# Patient Record
Sex: Male | Born: 1970 | Race: Asian | Hispanic: No | Marital: Married | Smoking: Never smoker
Health system: Southern US, Community
[De-identification: ages and names within clinical notes are randomized; demographics above are authoritative.]

## PROBLEM LIST (undated history)

## (undated) DIAGNOSIS — I10 Essential (primary) hypertension: Secondary | ICD-10-CM

---

## 2009-10-05 ENCOUNTER — Emergency Department (HOSPITAL_COMMUNITY): Admission: EM | Admit: 2009-10-05 | Discharge: 2009-10-05 | Payer: Self-pay | Admitting: Family Medicine

## 2012-03-22 ENCOUNTER — Emergency Department (HOSPITAL_COMMUNITY)
Admission: EM | Admit: 2012-03-22 | Discharge: 2012-03-22 | Disposition: A | Discharge status: Home / Self Care | Payer: Self-pay | Attending: Emergency Medicine | Admitting: Emergency Medicine

## 2012-03-22 ENCOUNTER — Encounter (HOSPITAL_COMMUNITY): Payer: Self-pay | Admitting: *Deleted

## 2012-03-22 DIAGNOSIS — S61309A Unspecified open wound of unspecified finger with damage to nail, initial encounter: Secondary | ICD-10-CM

## 2012-03-22 DIAGNOSIS — M79609 Pain in unspecified limb: Secondary | ICD-10-CM | POA: Insufficient documentation

## 2012-03-22 DIAGNOSIS — S61209A Unspecified open wound of unspecified finger without damage to nail, initial encounter: Secondary | ICD-10-CM | POA: Insufficient documentation

## 2012-03-22 NOTE — ED Notes (Signed)
To ED for eval of left thumb nail lifting from bed. Pt states he slammed thumb in a door prior and as it heals it has lifted.

## 2012-03-22 NOTE — ED Provider Notes (Signed)
History     CSN: [id number]  Arrival date & time 03/22/12  1538   First MD Initiated Contact with Patient 03/22/12 1604      Chief Complaint  Patient presents with  . Nail Problem    (Consider location/radiation/quality/duration/timing/severity/associated sxs/prior treatment) HPI  Patient presents to ER complaining of left thumb/thumb nail injury that occurred 2 weeks ago when he slammed thumb in door. Patient states that initially there was pain, swelling and subungual hematoma. Patient states he iced and took OTC pain meds with no complication and resolution of pain, swelling and hematoma however states that has hematoma resolved, the nail began to lift up from base and is "loose all the way to the cuticle, it's not attached anymore." denies associated pain.   History reviewed. No pertinent past medical history.  History reviewed. No pertinent past surgical history.  History reviewed. No pertinent family history.  History  Substance Use Topics  . Smoking status: Not on file  . Smokeless tobacco: Not on file  . Alcohol Use: No      Review of Systems  All other systems reviewed and are negative.    Allergies  Review of patient's allergies indicates no known allergies.  Home Medications  No current outpatient prescriptions on file.  BP 148/107  Pulse 88  Temp 98.2 F (36.8 C)  Resp 16  SpO2 98%  Physical Exam  Nursing note and vitals reviewed. Constitutional: He is oriented to person, place, and time. He appears well-developed and well-nourished. No distress.  HENT:  Head: Normocephalic and atraumatic.  Cardiovascular: Normal rate.   Pulmonary/Chest: Effort normal.  Abdominal: Bowel sounds are normal.  Musculoskeletal: Normal range of motion. He exhibits no edema and no tenderness.  Neurological: He is alert and oriented to person, place, and time.  Skin: Skin is warm and dry. No rash noted. He is not diaphoretic. No erythema.       Completely avulsed  left thumb nail with attachment only at cuticle at a 2mm area beneath cuticle. Underlying nail bed healed over without friability, bleeding, redness, swelling or TTP.   Psychiatric: He has a normal mood and affect.    ED Course  NAIL REMOVAL Date/Time: 03/22/2012 5:03 PM Performed by: Jenness Corner Authorized by: Jenness Corner Consent: Verbal consent obtained. Risks and benefits: risks, benefits and alternatives were discussed Consent given by: patient Patient understanding: patient states understanding of the procedure being performed Patient consent: the patient's understanding of the procedure matches consent given Patient identity confirmed: verbally with patient Location: left hand Location details: left thumb Patient sedated: no Preparation: skin prepped with alcohol Amount removed: complete Nail bed sutured: no Nail matrix removed: none Dressing: antibiotic ointment Patient tolerance: Patient tolerated the procedure well with no immediate complications.   (including critical care time)  Labs Reviewed - No data to display No results found.   No diagnosis found.    MDM  Avulsed thumb nail completely to cuticle with underlying nail bed healed over with no bleeding or friability. Nail removed back to cuticle and explained to patient that IF he gets nail growth back that the nail could be deformed or he may not get regrowth. Delay in presentation prevents opportunity to salvage nail if it even could have been salvaged at time of injury. Good cap refill refill of nail bed with normal sensation. FROM of entire thumb without pain. No signs of infection.          Jenness Corner, [place] 03/22/12  1713 

## 2012-03-22 NOTE — ED Provider Notes (Signed)
Medical screening examination/treatment/procedure(s) were performed by non-physician practitioner and as supervising physician I was immediately available for consultation/collaboration.  Cheri Guppy, MD 03/22/12 1929

## 2012-03-22 NOTE — Discharge Instructions (Signed)
Fingernail or Toenail Loss All or part of your fingernail or toenail has been lost. This may or may not grow back as a normal nail. A special non-stick bandage has been put on your finger or toe tightly to prevent bleeding. HOME CARE INSTRUCTIONS  The tips of fingers and toes are full of nerves and injuries are often very painful. The following will help you decrease the pain and obtain the best outcome.  Keep your hand or foot elevated above your heart to relieve pain and swelling. This will require lying in bed or on a couch with the hand or leg on pillows or sitting in a recliner with the leg up. Letting your hand or leg dangle may increase swelling, slow healing and cause throbbing pain.   Keep your dressing dry and clean.   Change your bandage in 24 hours after going home.   After your bandage is changed, soak your hand or foot in warm soapy water for 10 to 20 minutes. Do this 3 times per day. This helps reduce pain and swelling. After soaking, apply a clean, dry bandage. Change your bandage if it is wet or dirty.   Only take over-the-counter or prescription medicines for pain, discomfort, or fever as directed by your caregiver.   See your caregiver as needed for problems.  SEEK IMMEDIATE MEDICAL CARE IF:   You have increased pain, swelling, drainage, or bleeding.   You have a fever.  MAKE SURE YOU:   Understand these instructions.   Will watch your condition.   Will get help right away if you are not doing well or get worse.  Document Released: 10/07/2006 Document Revised: 11/04/2011 Document Reviewed: 12/27/2006 [employer] Patient Information 2012 [employer]. 

## 2018-01-25 ENCOUNTER — Ambulatory Visit: Payer: BLUE CROSS/BLUE SHIELD | Admitting: Podiatry

## 2018-01-25 ENCOUNTER — Other Ambulatory Visit: Payer: Self-pay | Admitting: Podiatry

## 2018-01-25 ENCOUNTER — Encounter: Payer: Self-pay | Admitting: Podiatry

## 2018-01-25 ENCOUNTER — Ambulatory Visit (INDEPENDENT_AMBULATORY_CARE_PROVIDER_SITE_OTHER): Payer: BLUE CROSS/BLUE SHIELD

## 2018-01-25 DIAGNOSIS — L84 Corns and callosities: Secondary | ICD-10-CM

## 2018-01-25 DIAGNOSIS — M7751 Other enthesopathy of right foot: Secondary | ICD-10-CM | POA: Diagnosis not present

## 2018-01-25 DIAGNOSIS — M779 Enthesopathy, unspecified: Secondary | ICD-10-CM

## 2018-01-25 DIAGNOSIS — M79671 Pain in right foot: Secondary | ICD-10-CM

## 2018-01-25 NOTE — Progress Notes (Signed)
   Subjective:    Patient ID: Dominic Hernandez, male    DOB: 03/16/1971, 47 y.o.   MRN: [id number]  HPI    Review of Systems  All other systems reviewed and are negative.      Objective:   Physical Exam        Assessment & Plan:

## 2018-01-25 NOTE — Progress Notes (Signed)
Subjective:   Patient ID: Dominic Hernandez, male   DOB: 47 y.o.   MRN: [id number]   HPI Patient presents with quite a bit of discomfort plantar aspect right foot with keratotic lesion formation that is been thick and he states is been there for a number years with no history.  States he can get tender at times   Review of Systems  All other systems reviewed and are negative.       Objective:  Physical Exam  Constitutional: He appears well-developed and well-nourished.  Cardiovascular: Intact distal pulses.  Pulmonary/Chest: Effort normal.  Musculoskeletal: Normal range of motion.  Neurological: He is alert.  Skin: Skin is warm.  Nursing note and vitals reviewed.   Neurovascular status intact muscle strength adequate range of motion within normal limits with patient found to have a large keratotic lesion in the right arch with no proximal spread or odor or drainage noted     Assessment:  Abnormal keratotic tissue formation right with no apparent cause     Plan:  H&P x-ray reviewed condition discussed and using sharp sterile his mentation debridement accomplished.  This will be done periodically and patient will be seen back earlier if any issues should occur  X-rays indicate there is no indication to calcification or abnormal bone structure

## 2018-02-14 ENCOUNTER — Emergency Department (HOSPITAL_COMMUNITY)
Admission: EM | Admit: 2018-02-14 | Discharge: 2018-02-15 | Disposition: A | Discharge status: Home / Self Care | Payer: BLUE CROSS/BLUE SHIELD | Attending: Emergency Medicine | Admitting: Emergency Medicine

## 2018-02-14 ENCOUNTER — Encounter (HOSPITAL_COMMUNITY): Payer: Self-pay | Admitting: Emergency Medicine

## 2018-02-14 ENCOUNTER — Emergency Department (HOSPITAL_COMMUNITY): Payer: BLUE CROSS/BLUE SHIELD

## 2018-02-14 DIAGNOSIS — R1110 Vomiting, unspecified: Secondary | ICD-10-CM | POA: Diagnosis not present

## 2018-02-14 DIAGNOSIS — I10 Essential (primary) hypertension: Secondary | ICD-10-CM | POA: Diagnosis not present

## 2018-02-14 DIAGNOSIS — R42 Dizziness and giddiness: Secondary | ICD-10-CM | POA: Insufficient documentation

## 2018-02-14 DIAGNOSIS — Z79899 Other long term (current) drug therapy: Secondary | ICD-10-CM | POA: Insufficient documentation

## 2018-02-14 HISTORY — DX: Essential (primary) hypertension: I10

## 2018-02-14 LAB — URINALYSIS, ROUTINE W REFLEX MICROSCOPIC
Bilirubin Urine: NEGATIVE
Glucose, UA: NEGATIVE mg/dL
Hgb urine dipstick: NEGATIVE
Ketones, ur: 5 mg/dL — AB
LEUKOCYTES UA: NEGATIVE
NITRITE: NEGATIVE
PROTEIN: NEGATIVE mg/dL
Specific Gravity, Urine: 1.015 (ref 1.005–1.030)
pH: 5 (ref 5.0–8.0)

## 2018-02-14 LAB — BASIC METABOLIC PANEL
Anion gap: 11 (ref 5–15)
BUN: 10 mg/dL (ref 6–20)
CALCIUM: 8.9 mg/dL (ref 8.9–10.3)
CO2: 20 mmol/L — ABNORMAL LOW (ref 22–32)
CREATININE: 1.08 mg/dL (ref 0.61–1.24)
Chloride: 105 mmol/L (ref 101–111)
Glucose, Bld: 137 mg/dL — ABNORMAL HIGH (ref 65–99)
Potassium: 3.5 mmol/L (ref 3.5–5.1)
SODIUM: 136 mmol/L (ref 135–145)

## 2018-02-14 LAB — CBC
HCT: 44.1 % (ref 39.0–52.0)
Hemoglobin: 14.4 g/dL (ref 13.0–17.0)
MCH: 26 pg (ref 26.0–34.0)
MCHC: 32.7 g/dL (ref 30.0–36.0)
MCV: 79.6 fL (ref 78.0–100.0)
PLATELETS: 385 10*3/uL (ref 150–400)
RBC: 5.54 MIL/uL (ref 4.22–5.81)
RDW: 13.4 % (ref 11.5–15.5)
WBC: 8.8 10*3/uL (ref 4.0–10.5)

## 2018-02-14 LAB — CBG MONITORING, ED: GLUCOSE-CAPILLARY: 140 mg/dL — AB (ref 65–99)

## 2018-02-14 MED ORDER — ONDANSETRON 4 MG PO TBDP
4.0000 mg | ORAL_TABLET | Freq: Once | ORAL | Status: AC
  Administered 2018-02-14: 4 mg via ORAL
  Filled 2018-02-14: qty 1

## 2018-02-14 NOTE — ED Triage Notes (Signed)
Pt reports dizziness onset yesterday. Pt states he stood up and had a syncopal episode while in the restroom, reports hitting his head. States that today he has had HA and nausea, pt noted to be hypertensive in triage.

## 2018-02-15 MED ORDER — MECLIZINE HCL 25 MG PO TABS
25.0000 mg | ORAL_TABLET | Freq: Once | ORAL | Status: AC
  Administered 2018-02-15: 25 mg via ORAL
  Filled 2018-02-15: qty 1

## 2018-02-15 MED ORDER — MECLIZINE HCL 25 MG PO TABS: 25.0000 mg | ORAL_TABLET | Freq: Three times a day (TID) | ORAL | 0 refills | Status: DC | PRN

## 2018-02-15 MED ORDER — SODIUM CHLORIDE 0.9 % IV BOLUS (SEPSIS)
1000.0000 mL | Freq: Once | INTRAVENOUS | Status: AC
  Administered 2018-02-15: 1000 mL via INTRAVENOUS

## 2018-02-15 NOTE — ED Provider Notes (Signed)
[facility] EMERGENCY DEPARTMENT Provider Note   CSN: [id number] Arrival date & time: 02/14/18  1922     History   Chief Complaint Chief Complaint  Patient presents with  . Dizziness    HPI Dominic Hernandez is a 47 y.o. male.  HPI  This is a 47 year old male with a history of hypertension who presents with dizziness.  Primary language is Nepali.  Family member translates at the bedside.  Patient reports intermittent dizziness over the last several months.  He describes dizziness as room spinning.  It is worse with moving of his head and position change.  However, yesterday he had worsening dizziness.  He stood up and fell to the floor.  He denies actually losing consciousness.  He did hit his head.  At times he has nonbilious, nonbloody emesis with dizziness.  He denies headache to me but endorsed headache to triage.  He does report light sensitivity and nausea.  Denies any focal weakness, numbness, tingling.  Past Medical History:  Diagnosis Date  . Hypertension     There are no active problems to display for this patient.   History reviewed. No pertinent surgical history.     Home Medications    Prior to Admission medications   Medication Sig Start Date End Date Taking? Authorizing Provider  amLODipine-atorvastatin (CADUET) 5-20 MG tablet Take 1 tablet by mouth daily.   Yes [provider]  HYDROcodone-acetaminophen (NORCO/VICODIN) 5-325 MG tablet Take 1 tablet by mouth every 6 (six) hours as needed for moderate pain.   Yes [provider]  tiZANidine (ZANAFLEX) 4 MG tablet Take 4 mg by mouth.   Yes [provider]  meclizine (ANTIVERT) 25 MG tablet Take 1 tablet (25 mg total) by mouth 3 (three) times daily as needed for dizziness. 02/15/18   Yamilee Harmes, Mayer Masker, MD    Family History No family history on file.  Social History Social History   Tobacco Use  . Smoking status: Never Smoker  . Smokeless tobacco: Never Used    Substance Use Topics  . Alcohol use: No  . Drug use: No     Allergies   Patient has no known allergies.   Review of Systems Review of Systems  Constitutional: Negative for fever.  Respiratory: Negative for shortness of breath.   Cardiovascular: Negative for chest pain.  Gastrointestinal: Positive for nausea and vomiting. Negative for abdominal pain.  Genitourinary: Negative for dysuria.  Musculoskeletal: Negative for back pain.  Neurological: Positive for dizziness, light-headedness and headaches. Negative for speech difficulty, weakness and numbness.  All other systems reviewed and are negative.    Physical Exam Updated Vital Signs BP 120/86   Pulse 70   Temp 98.3 F (36.8 C) (Oral)   Resp 17   Ht 5\' 2"  (1.575 m)   Wt 68 kg (150 lb)   SpO2 98%   BMI 27.44 kg/m   Physical Exam  Constitutional: He is oriented to person, place, and time. He appears well-developed and well-nourished. No distress.  HENT:  Head: Normocephalic and atraumatic.  Eyes: Pupils are equal, round, and reactive to light.  Horizontal nystagmus noted  Cardiovascular: Normal rate, regular rhythm and normal heart sounds.  No murmur heard. Pulmonary/Chest: Effort normal and breath sounds normal. No respiratory distress. He has no wheezes.  Abdominal: Soft. Bowel sounds are normal. There is no tenderness. There is no rebound.  Musculoskeletal: He exhibits no edema.  Neurological: He is alert and oriented to person, place, and time.  Cranial nerves II through XII intact, 5 out of 5 strength in all 4 extremities, no dysmetria to finger-nose-finger  Skin: Skin is warm and dry.  Psychiatric: He has a normal mood and affect.  Nursing note and vitals reviewed.    ED Treatments / Results  Labs (all labs ordered are listed, but only abnormal results are displayed) Labs Reviewed  BASIC METABOLIC PANEL - Abnormal; Notable for the following components:      Result Value   CO2 20 (*)    Glucose, Bld  137 (*)    All other components within normal limits  URINALYSIS, ROUTINE W REFLEX MICROSCOPIC - Abnormal; Notable for the following components:   Ketones, ur 5 (*)    All other components within normal limits  CBG MONITORING, ED - Abnormal; Notable for the following components:   Glucose-Capillary 140 (*)    All other components within normal limits  CBC    EKG  EKG Interpretation  Date/Time:  Tuesday February 14 2018 19:50:43 EDT Ventricular Rate:  80 PR Interval:  152 QRS Duration: 90 QT Interval:  394 QTC Calculation: 454 R Axis:   64 Text Interpretation:  Normal sinus rhythm Normal ECG No prior for comparison Confirmed by Ross Marcus ([id number]) on 02/15/2018 1:24:38 AM       Radiology Ct Head Wo Contrast  Result Date: 02/14/2018 CLINICAL DATA:  Vertigo.  Vomiting.  Fall yesterday. EXAM: CT HEAD WITHOUT CONTRAST TECHNIQUE: Contiguous axial images were obtained from the base of the skull through the vertex without intravenous contrast. COMPARISON:  None. FINDINGS: Brain: There is no evidence of acute infarct, intracranial hemorrhage, mass, midline shift, or extra-axial fluid collection. The ventricles and sulci are normal. Vascular: No hyperdense vessel. Skull: No fracture or focal osseous lesion. Sinuses/Orbits: No acute finding in the included paranasal sinuses. Clear mastoid air cells. Unremarkable orbits. Other: None. IMPRESSION: Negative head CT. Electronically Signed   By: Sebastian Ache M.D.   On: 02/14/2018 20:40    Procedures Procedures (including critical care time)  Medications Ordered in ED Medications  ondansetron (ZOFRAN-ODT) disintegrating tablet 4 mg (4 mg Oral Given 02/14/18 2036)  meclizine (ANTIVERT) tablet 25 mg (25 mg Oral Given 02/15/18 0207)  sodium chloride 0.9 % bolus 1,000 mL (0 mLs Intravenous Stopped 02/15/18 0242)     Initial Impression / Assessment and Plan / ED Course  I have reviewed the triage vital signs and the nursing notes.  Pertinent  labs & imaging results that were available during my care of the patient were reviewed by me and considered in my medical decision making (see chart for details).  Clinical Course as of Feb 15 301  Wed Feb 15, 2018  0301 He feels much better after fluids and meclizine.  [CH]    Clinical Course User Index [CH] Gizzelle Lacomb, Mayer Masker, MD    Presents with dizziness which is positional.  He reports several months of intermittent dizziness but had an episode yesterday where he fell.  Head CT is negative.  Vital signs are reassuring.  Neurologic exam is normal.  No evidence of cerebellar dysfunction.  History of highly suggestive of peripheral vertigo.  EKG shows no signs of arrhythmia.  Patient was given fluids and meclizine.  On repeat evaluation he feels much better.  Workup has been largely reassuring including basic lab work.  Will discharge with meclizine.  After history, exam, and medical workup I feel the patient has been appropriately medically screened and is safe for discharge home. Pertinent diagnoses  were discussed with the patient. Patient was given return precautions.   Final Clinical Impressions(s) / ED Diagnoses   Final diagnoses:  Vertigo    ED Discharge Orders        Ordered    meclizine (ANTIVERT) 25 MG tablet  3 times daily PRN     02/15/18 0302       Shon BatonHorton, Ajia Chadderdon F, MD 02/15/18 [phone]

## 2018-08-07 IMAGING — CT CT HEAD W/O CM
4 series · 16 of 47 positions shown, 18 images · non-contrast
Comparison: None.

CLINICAL DATA: Vertigo.  Vomiting.  Fall yesterday.

EXAM:
CT HEAD WITHOUT CONTRAST
TECHNIQUE: Contiguous axial images were obtained from the base of the skull
through the vertex without intravenous contrast.

[Series 3: head wo · axial · 0.43mm/px · z∈[-154,-34]mm · 7 of 32 slices shown, 9 images]
[im 4/32  brain]
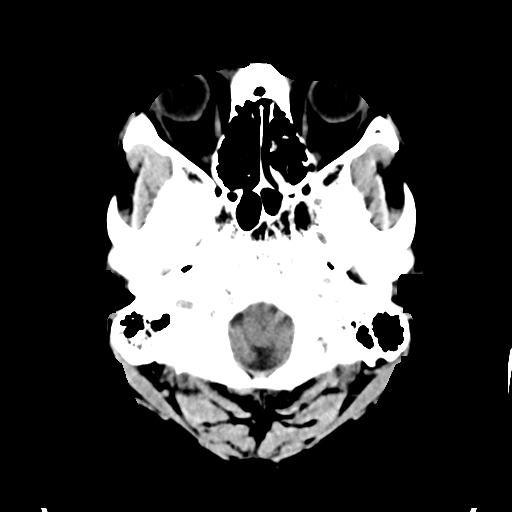
[im 4/32  bone]
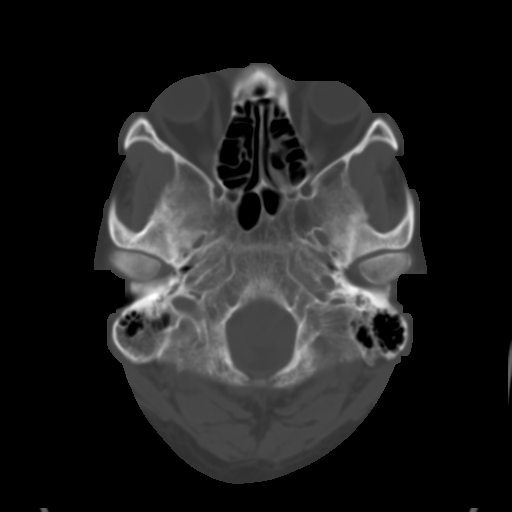
[im 8/32  brain]
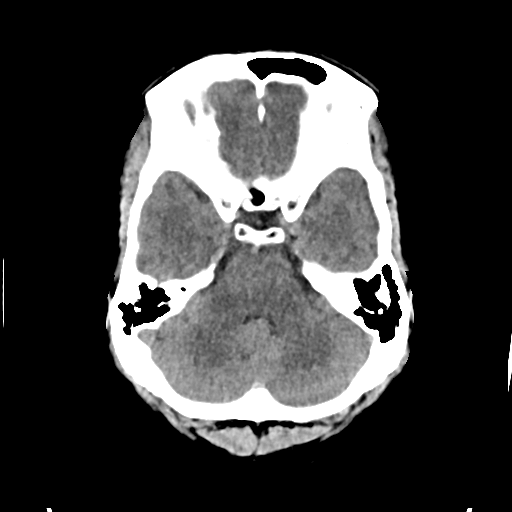
[im 12/32  brain]
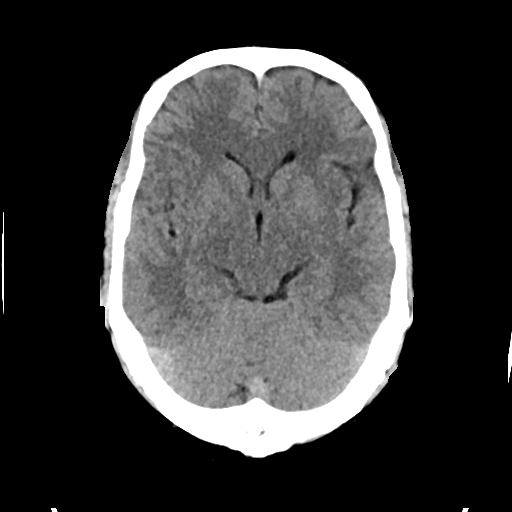
[im 16/32  brain]
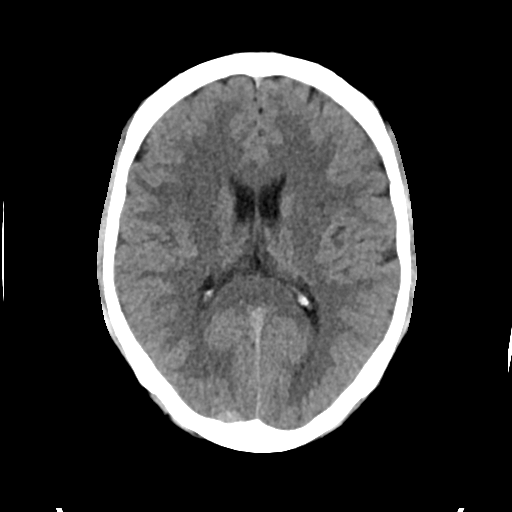
[im 20/32  brain]
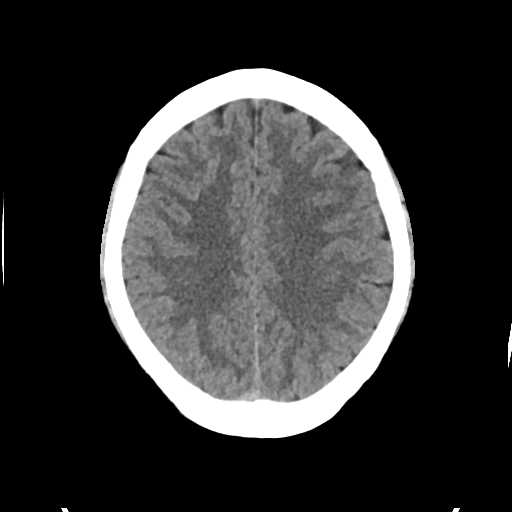
[im 20/32  bone]
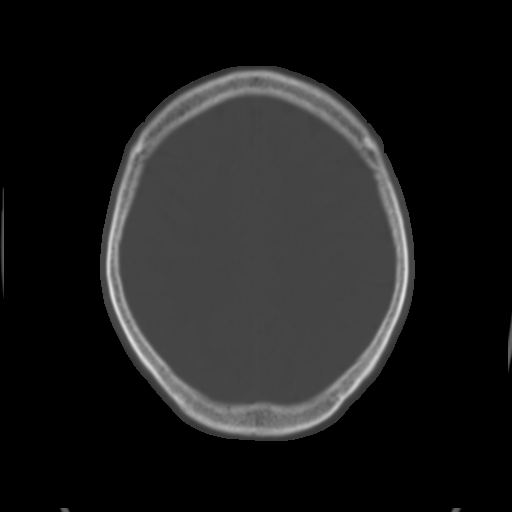
[im 24/32  brain]
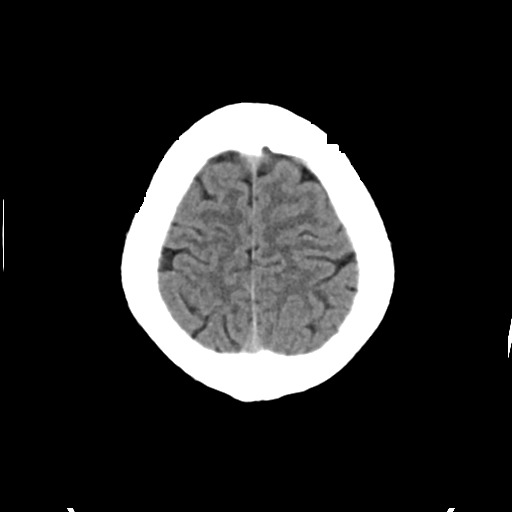
[im 28/32  brain]
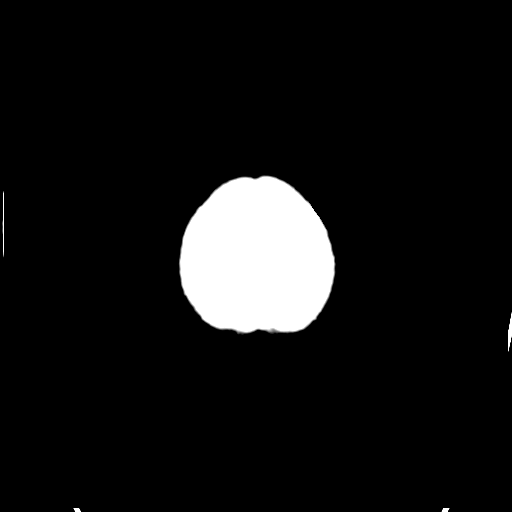

[Series 4: head bone · axial · 0.43mm/px · z∈[-156,-124]mm · 3 of 80 slices shown]
[im 8/80  bone]
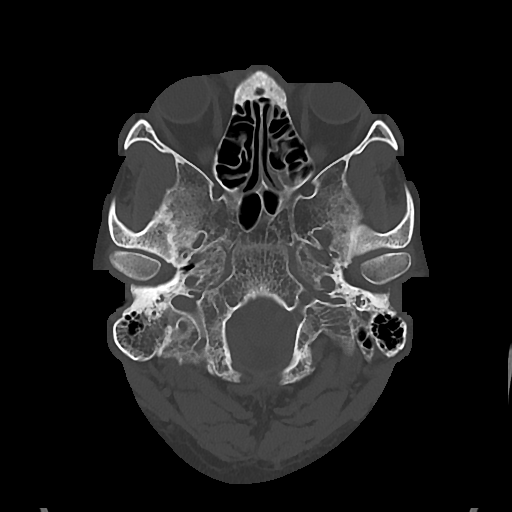
[im 16/80  bone]
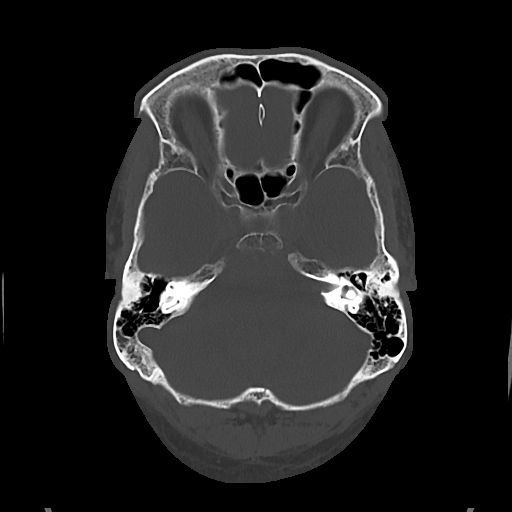
[im 24/80  bone]
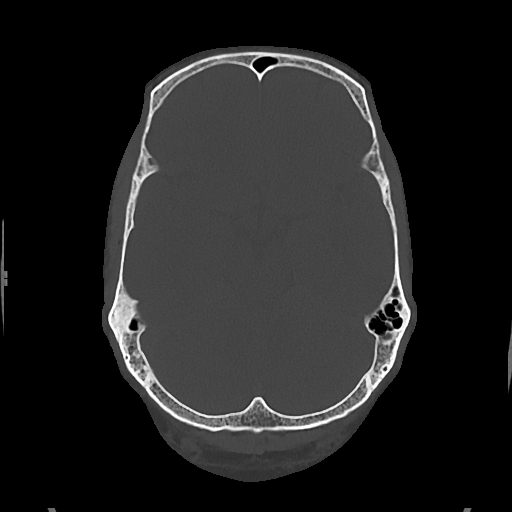

[Series 5: cor soft · coronal · 0.29mm/px · 3 of 68 slices shown]
[im 23/68  brain]
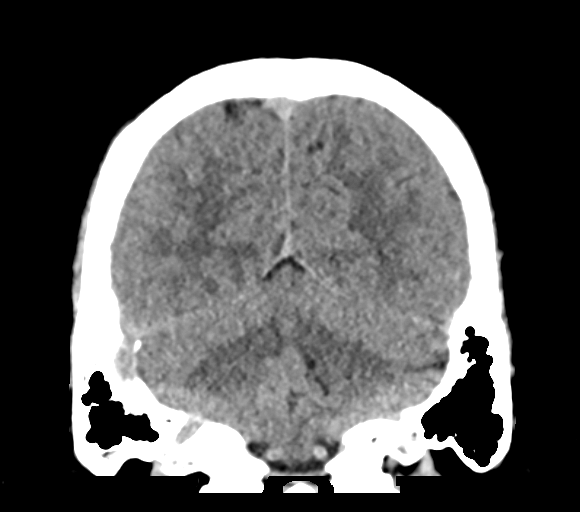
[im 30/68  brain]
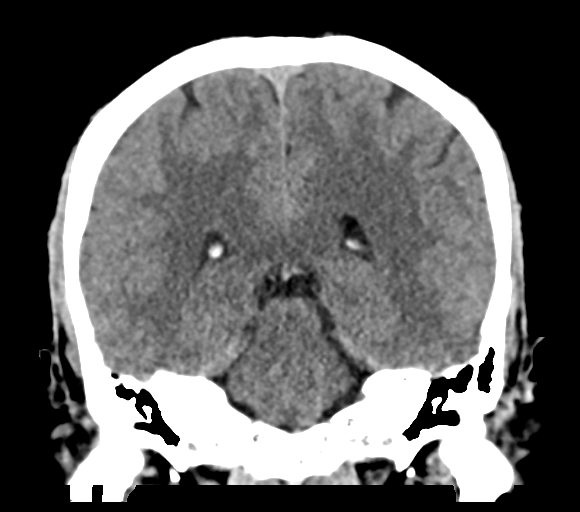
[im 38/68  brain]
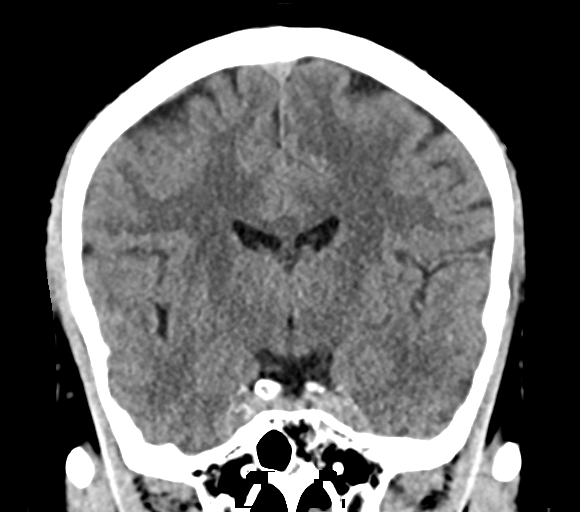

[Series 6: sag soft · sagittal · 0.31mm/px · 3 of 57 slices shown]
[im 19/57  brain]
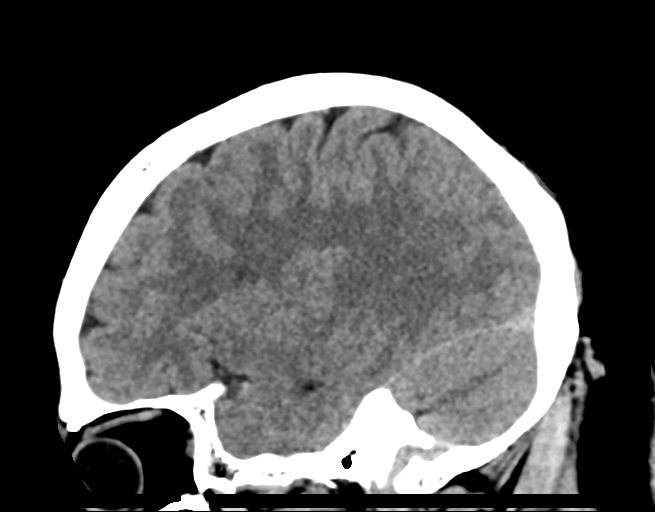
[im 29/57  brain]
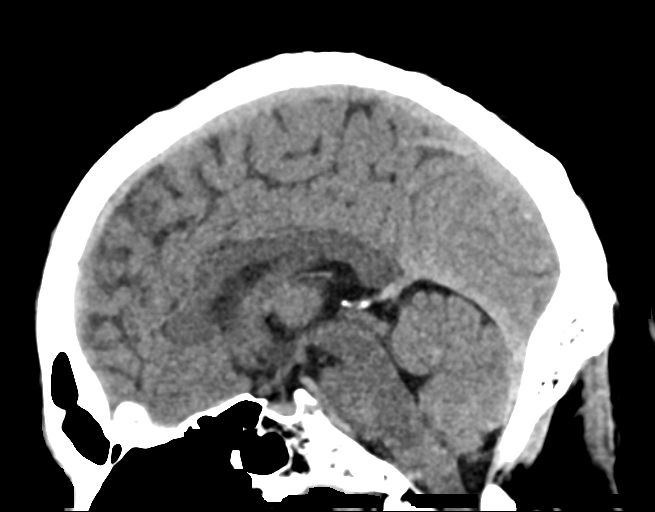
[im 38/57  brain]
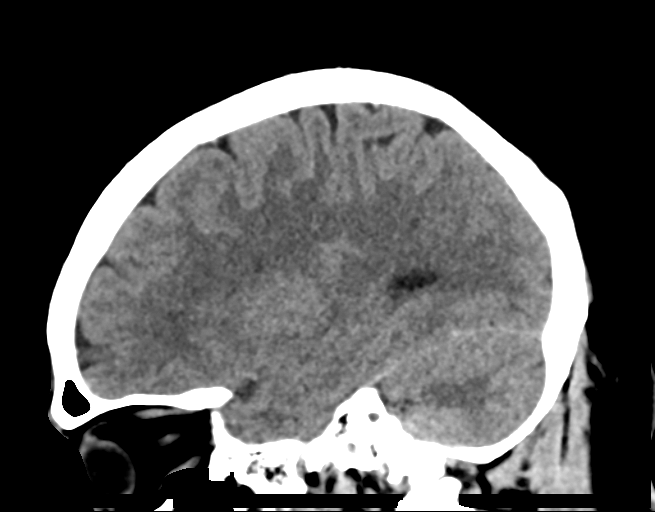

[16 of 47 positions shown; findings below may reference images not displayed]

FINDINGS: Brain: There is no evidence of acute infarct, intracranial
hemorrhage, mass, midline shift, or extra-axial fluid collection.
The ventricles and sulci are normal.

Vascular: No hyperdense vessel.

Skull: No fracture or focal osseous lesion.

Sinuses/Orbits: No acute finding in the included paranasal sinuses.
Clear mastoid air cells. Unremarkable orbits.

Other: None.
IMPRESSION: Negative head CT.

## 2019-10-01 ENCOUNTER — Other Ambulatory Visit: Payer: Self-pay

## 2019-10-01 DIAGNOSIS — Z20822 Contact with and (suspected) exposure to covid-19: Secondary | ICD-10-CM

## 2019-10-03 LAB — NOVEL CORONAVIRUS, NAA: SARS-CoV-2, NAA: DETECTED — AB

## 2019-10-15 ENCOUNTER — Telehealth: Payer: Self-pay | Admitting: *Deleted

## 2019-10-15 NOTE — Telephone Encounter (Signed)
[name] a nurse from [facility], called to verify pt contact information, due COVID results .

## 2020-04-03 ENCOUNTER — Ambulatory Visit: Payer: Self-pay | Admitting: Podiatry

## 2020-04-07 ENCOUNTER — Ambulatory Visit: Payer: BLUE CROSS/BLUE SHIELD | Admitting: Podiatry

## 2020-04-07 ENCOUNTER — Other Ambulatory Visit: Payer: Self-pay

## 2020-04-07 ENCOUNTER — Encounter: Payer: Self-pay | Admitting: Podiatry

## 2020-04-07 VITALS — Temp 98.1°F

## 2020-04-07 DIAGNOSIS — L84 Corns and callosities: Secondary | ICD-10-CM

## 2020-04-07 DIAGNOSIS — L600 Ingrowing nail: Secondary | ICD-10-CM

## 2020-04-07 MED ORDER — NEOMYCIN-POLYMYXIN-HC 3.5-10000-1 OT SOLN: OTIC | 0 refills | Status: AC

## 2020-04-07 NOTE — Patient Instructions (Signed)

## 2020-04-09 NOTE — Progress Notes (Signed)
Subjective:   Patient ID: Dominic Hernandez, male   DOB: 49 y.o.   MRN: [id number]   HPI Patient presents stating that he has a painful ingrown toenail of his right big toe that he is tried to trim and soak without relief and he like it fixed.  Also has some lesion underneath the right arch that he like trimmed   ROS      Objective:  Physical Exam  Neurovascular status intact with incurvated right hallux medial border that is painful when pressed and make shoe gear difficult with no active redness or drainage noted.  Lesion plantar arch right     Assessment:  Significant ingrown toenail deformity right hallux medial border with pain along with keratotic tissue formation     Plan:  H&P reviewed condition recommended correction allowed him to read consent form for permanent procedure.  I explained the procedure to patient and reviewed with him risk and he signed consent form and today I infiltrated the right hallux 60 mg like Marcaine mixture sterile prep done using sterile instrumentation I removed the right nail border and applied chemical phenol 3 applications 30 seconds followed by alcohol lavage and sterile dressing.  Gave instructions on soaks and reappoint to recheck

## 2024-12-31 ENCOUNTER — Encounter: Payer: Self-pay | Admitting: Gastroenterology
